# Patient Record
Sex: Female | Born: 1960 | Race: White | Hispanic: No | Smoking: Current every day smoker
Health system: Southern US, Community
[De-identification: ages and names within clinical notes are randomized; demographics above are authoritative.]

## PROBLEM LIST (undated history)

## (undated) DIAGNOSIS — N3289 Other specified disorders of bladder: Secondary | ICD-10-CM

## (undated) DIAGNOSIS — K5900 Constipation, unspecified: Secondary | ICD-10-CM

## (undated) DIAGNOSIS — R159 Full incontinence of feces: Secondary | ICD-10-CM

## (undated) DIAGNOSIS — G63 Polyneuropathy in diseases classified elsewhere: Secondary | ICD-10-CM

## (undated) DIAGNOSIS — L97409 Non-pressure chronic ulcer of unspecified heel and midfoot with unspecified severity: Secondary | ICD-10-CM

## (undated) DIAGNOSIS — K219 Gastro-esophageal reflux disease without esophagitis: Secondary | ICD-10-CM

## (undated) DIAGNOSIS — G8929 Other chronic pain: Secondary | ICD-10-CM

## (undated) DIAGNOSIS — R32 Unspecified urinary incontinence: Secondary | ICD-10-CM

## (undated) DIAGNOSIS — F32A Depression, unspecified: Secondary | ICD-10-CM

## (undated) DIAGNOSIS — G834 Cauda equina syndrome: Secondary | ICD-10-CM

## (undated) DIAGNOSIS — F419 Anxiety disorder, unspecified: Secondary | ICD-10-CM

## (undated) DIAGNOSIS — M545 Low back pain, unspecified: Secondary | ICD-10-CM

## (undated) DIAGNOSIS — F329 Major depressive disorder, single episode, unspecified: Secondary | ICD-10-CM

## (undated) HISTORY — DX: Low back pain: M54.5

## (undated) HISTORY — DX: Unspecified urinary incontinence: R32

## (undated) HISTORY — DX: Polyneuropathy in diseases classified elsewhere: G63

## (undated) HISTORY — PX: RECTOCELE REPAIR: SHX761

## (undated) HISTORY — DX: Gastro-esophageal reflux disease without esophagitis: K21.9

## (undated) HISTORY — DX: Cauda equina syndrome: G83.4

## (undated) HISTORY — DX: Low back pain, unspecified: M54.50

## (undated) HISTORY — DX: Other chronic pain: G89.29

## (undated) HISTORY — DX: Constipation, unspecified: K59.00

## (undated) HISTORY — PX: GALLBLADDER SURGERY: SHX652

## (undated) HISTORY — DX: Other specified disorders of bladder: N32.89

## (undated) HISTORY — DX: Major depressive disorder, single episode, unspecified: F32.9

## (undated) HISTORY — DX: Non-pressure chronic ulcer of unspecified heel and midfoot with unspecified severity: L97.409

## (undated) HISTORY — PX: LUMBAR LAMINECTOMY: SHX95

## (undated) HISTORY — DX: Full incontinence of feces: R15.9

## (undated) HISTORY — DX: Anxiety disorder, unspecified: F41.9

## (undated) HISTORY — PX: TUBAL LIGATION: SHX77

## (undated) HISTORY — DX: Depression, unspecified: F32.A

---

## 2013-05-09 ENCOUNTER — Encounter: Payer: Self-pay | Admitting: Neurology

## 2013-05-12 ENCOUNTER — Ambulatory Visit (INDEPENDENT_AMBULATORY_CARE_PROVIDER_SITE_OTHER): Payer: Medicaid Other | Admitting: Neurology

## 2013-05-12 ENCOUNTER — Encounter: Payer: Self-pay | Admitting: Neurology

## 2013-05-12 VITALS — BP 143/88 | HR 71 | Ht 65.0 in | Wt 153.0 lb

## 2013-05-12 DIAGNOSIS — M545 Low back pain, unspecified: Secondary | ICD-10-CM

## 2013-05-12 MED ORDER — CARBAMAZEPINE 100 MG PO CHEW: 100.0000 mg | CHEWABLE_TABLET | Freq: Two times a day (BID) | ORAL | Status: DC

## 2013-05-12 NOTE — Progress Notes (Signed)
Reason for visit: Back pain  Tanya Carter is a 52 y.o. female  History of present illness:  Tanya Carter is a 52 year old right-handed white female with a history of chronic low back pain dating back 15-20 years. The patient was told that she had severe nerve root impingement prior to surgery. The patient has had 2 lumbosacral spine surgeries that were done in [place]. The patient has had some chronic low back pain since that time, and some difficulty controlling the bladder and bowels. The patient indicates that 4 months ago, she began having a significant increase in the low back pain. The patient has pain down both legs, worse on the right side. The patient will have an electric shock pain going up the right leg. The patient will have a dull achy pain in the left hip and back. The patient feels normal on the right leg. The patient has not had any falls but she feels as if the legs are weak. The patient denies any neck pain or shoulder or arm discomfort. The patient is on gabapentin without complete relief. The patient is sent to this office for further evaluation.  Past Medical History  Diagnosis Date  . Cauda equina compression   . Incontinence of feces   . Incontinence of urine   . Chronic pain     sacral  . Anxiety and depression   . Ulcer of heel and midfoot     Managed by [facility]  . GERD (gastroesophageal reflux disease)   . Lumbago   . Unspecified constipation   . Other specified disorders of bladder     Past Surgical History  Procedure Laterality Date  . Rectocele repair    . Gallbladder surgery    . Tubal ligation    . Lumbar laminectomy      2 occasions    Family History  Problem Relation Age of Onset  . Stroke Mother     Social history:  reports that she has been smoking.  She has never used smokeless tobacco. She reports that she does not drink alcohol or use illicit drugs.  Medications:  Current Outpatient Prescriptions on File Prior to  Visit  Medication Sig Dispense Refill  . cyanocobalamin (,VITAMIN B-12,) 1000 MCG/ML injection Inject 1,000 mcg into the muscle every 30 (thirty) days.      . DULoxetine (CYMBALTA) 60 MG capsule Take 60 mg by mouth daily.       . mupirocin ointment (BACTROBAN) 2 % Apply 2 application topically 3 (three) times daily.      . pantoprazole (PROTONIX) 40 MG tablet Take 40 mg by mouth daily.       No current facility-administered medications on file prior to visit.    Allergies:  Allergies  Allergen Reactions  . Klonopin [Clonazepam] Other (See Comments)    Delirium,Hallucination    ROS:  Out of a complete 14 system review of symptoms, the patient complains only of the following symptoms, and all other reviewed systems are negative.  Moles Urination problems, incontinence Increased thirst Headache, numbness, weakness Depression, anxiety, decreased energy Restless legs  Blood pressure 143/88, pulse 71, height 5\' 5"  (1.651 m), weight 153 lb (69.4 kg).  Physical Exam  General: The patient is alert and cooperative at the time of the examination.  Head: Pupils are equal, round, and reactive to light. Discs are flat bilaterally.  Neck: The neck is supple, no carotid bruits are noted.  Respiratory: The respiratory examination is clear.  Cardiovascular: The cardiovascular examination reveals a regular rate and rhythm, no obvious murmurs or rubs are noted.  Neuromuscular: The patient lacks about 15-20 of full flexion of the low back.  Skin: Extremities are without significant edema.  Neurologic Exam  Mental status:  Cranial nerves: Facial symmetry is present. There is good sensation of the face to pinprick and soft touch bilaterally. The strength of the facial muscles and the muscles to head turning and shoulder shrug are normal bilaterally. Speech is well enunciated, no aphasia or dysarthria is noted. Extraocular movements are full. Visual fields are full.  Motor: The motor  testing reveals 5 over 5 strength of all 4 extremities, but the patient does have some give way weakness proximally with the right leg. The patient is unable to walk on the toes on either side, she can walk with difficulty on heels bilaterally. Good symmetric motor tone is noted throughout.  Sensory: Sensory testing is intact to pinprick, soft touch, vibration sensation, and position sense on all 4 extremities, with exception of some decreased pinprick sensation on the right leg from the thigh level down. No evidence of extinction is noted.  Coordination: Cerebellar testing reveals good finger-nose-finger and heel-to-shin bilaterally.  Gait and station: Gait is normal. Tandem gait is unsteady. Romberg is negative, but is slightly unsteady. No drift is seen.  Reflexes: Deep tendon reflexes are symmetric and normal bilaterally, with exception that the ankle jerk reflexes are depressed bilaterally. Toes are downgoing bilaterally.   Assessment/Plan:  1. Low back pain, bilateral leg pain  The patient will be set up for MRI evaluation of the lumbosacral spine. Depending upon the results, the patient may be referred for an epidural steroid injection or for surgery. The patient will be given carbamazepine for the sharp electric shock pains that she is experiencing. The patient followup in 3-4 months.  Marlan Palau MD 05/12/2013 7:46 PM  [facility] [address] [place], [place] [address]  Phone [phone] Fax [phone]

## 2013-05-21 DIAGNOSIS — M545 Low back pain, unspecified: Secondary | ICD-10-CM

## 2013-05-23 ENCOUNTER — Telehealth: Payer: Self-pay | Admitting: Neurology

## 2013-05-23 ENCOUNTER — Other Ambulatory Visit: Payer: Self-pay | Admitting: Diagnostic Neuroimaging

## 2013-05-23 DIAGNOSIS — M545 Low back pain, unspecified: Secondary | ICD-10-CM

## 2013-05-23 NOTE — Telephone Encounter (Signed)
I called patient. The MRI of the lumbosacral spine shows multilevel facet joint arthritis, and some bone marrow edema at the L4-5 level. There may be potential for impingement of the L4 nerve roots bilaterally. I'll check EMG evaluation of the legs, but the patient wishes to have an epidural steroid injection, she will call our office as well.

## 2013-05-27 ENCOUNTER — Telehealth: Payer: Self-pay | Admitting: Neurology

## 2013-05-27 DIAGNOSIS — G544 Lumbosacral root disorders, not elsewhere classified: Secondary | ICD-10-CM

## 2013-05-28 NOTE — Telephone Encounter (Signed)
I called the patient. The patient gained some benefit from carbamazepine, but the relief is not significant. The patient is only on 100 mg twice daily, we will increase to 200 mg twice daily. The patient will be coming in for EMG and nerve conduction studies, and she wants to go ahead and get an epidural steroid injection of the low back. She is applying for disability.

## 2013-05-30 ENCOUNTER — Other Ambulatory Visit: Payer: Self-pay | Admitting: Neurology

## 2013-05-30 DIAGNOSIS — M549 Dorsalgia, unspecified: Secondary | ICD-10-CM

## 2013-06-02 ENCOUNTER — Other Ambulatory Visit: Payer: Self-pay

## 2013-06-02 ENCOUNTER — Telehealth: Payer: Self-pay | Admitting: Neurology

## 2013-06-02 MED ORDER — CARBAMAZEPINE 200 MG PO TABS: 200.0000 mg | ORAL_TABLET | Freq: Two times a day (BID) | ORAL | Status: DC

## 2013-06-02 NOTE — Telephone Encounter (Signed)
Rx sent 

## 2013-06-05 ENCOUNTER — Ambulatory Visit
Admission: RE | Admit: 2013-06-05 | Discharge: 2013-06-05 | Disposition: A | Discharge status: Home / Self Care | Payer: Medicaid Other | Source: Ambulatory Visit | Attending: Neurology | Admitting: Neurology

## 2013-06-05 VITALS — BP 127/87 | HR 72

## 2013-06-05 DIAGNOSIS — M549 Dorsalgia, unspecified: Secondary | ICD-10-CM

## 2013-06-05 MED ORDER — IOHEXOL 180 MG/ML  SOLN
1.0000 mL | Freq: Once | INTRAMUSCULAR | Status: AC | PRN
  Administered 2013-06-05: 1 mL via EPIDURAL

## 2013-06-05 MED ORDER — METHYLPREDNISOLONE ACETATE 40 MG/ML INJ SUSP (RADIOLOG
120.0000 mg | Freq: Once | INTRAMUSCULAR | Status: AC
  Administered 2013-06-05: 120 mg via EPIDURAL

## 2013-06-16 ENCOUNTER — Encounter: Payer: Self-pay | Admitting: Neurology

## 2013-06-16 ENCOUNTER — Encounter (INDEPENDENT_AMBULATORY_CARE_PROVIDER_SITE_OTHER): Payer: Self-pay

## 2013-06-16 ENCOUNTER — Ambulatory Visit (INDEPENDENT_AMBULATORY_CARE_PROVIDER_SITE_OTHER): Payer: Medicaid Other | Admitting: Neurology

## 2013-06-16 DIAGNOSIS — G63 Polyneuropathy in diseases classified elsewhere: Secondary | ICD-10-CM

## 2013-06-16 DIAGNOSIS — M545 Low back pain: Secondary | ICD-10-CM

## 2013-06-16 DIAGNOSIS — Z5181 Encounter for therapeutic drug level monitoring: Secondary | ICD-10-CM

## 2013-06-16 DIAGNOSIS — Z0289 Encounter for other administrative examinations: Secondary | ICD-10-CM

## 2013-06-16 HISTORY — DX: Polyneuropathy in diseases classified elsewhere: G63

## 2013-06-16 NOTE — Progress Notes (Signed)
Tanya Carter is a 52 year old patient with a history of lumbosacral spine surgery done on 2 occasions. The patient has had MRI evaluation the lumbosacral spine that shows evidence of spondylosis, with facet joint edema and possible bilateral L4 nerve root impingement. The patient comes back in for EMG and nerve conduction study evaluation.  Nerve conduction studies show evidence of a peripheral neuropathy, but EMG evaluations of both lower extremity suggests that the neuropathy is not as severe as nerve conduction study would suggest. The patient likely has bilateral chronic S1 radiculopathies. No acute radiculopathies were noted.  The patient has gained some benefit with the carbamazepine. A carbamazepine level will be checked today, and blood work will be done to look for etiologies of peripheral neuropathies. The patient will followup in 4 months.

## 2013-06-16 NOTE — Procedures (Signed)
HISTORY:  Tanya Carter is a 52 year old patient with a history of lumbosacral spine surgery on 2 occasions. The patient has chronic low back pain, and discomfort down the legs, right greater than left. The patient reports a crawling, tingling sensation in both feet. This is worse in the evening hours. The patient is being evaluated for a possible peripheral neuropathy or a lumbosacral radiculopathy.  NERVE CONDUCTION STUDIES:  Nerve conduction studies were performed on the right upper extremity. The distal motor latencies and motor amplitudes for the median and ulnar nerves were within normal limits. The F wave latencies and nerve conduction velocities for these nerves were also normal. The sensory latencies for the median and ulnar nerves were normal.  Nerve conduction studies were performed on both lower extremities. The distal motor latencies for the peroneal and posterior tibial nerves were normal bilaterally. The motor amplitudes for these nerves were low bilaterally, with normal nerve conduction velocities for these nerves bilaterally. The H reflex latencies were absent bilaterally, and the peroneal sensory latencies were absent bilaterally.  EMG STUDIES:  EMG study was performed on the right lower extremity:  The tibialis anterior muscle reveals 2 to 4K motor units with full recruitment. No fibrillations or positive waves were seen. The peroneus tertius muscle reveals 2 to 4K motor units with full recruitment. No fibrillations or positive waves were seen. The medial gastrocnemius muscle reveals 1 to 3K motor units with decreased recruitment. No fibrillations or positive waves were seen. The vastus lateralis muscle reveals 2 to 4K motor units with full recruitment. No fibrillations or positive waves were seen. The iliopsoas muscle reveals 2 to 4K motor units with full recruitment. No fibrillations or positive waves were seen. The biceps femoris muscle (long head) reveals 2 to 4K motor  units with full recruitment. No fibrillations or positive waves were seen. The lumbosacral paraspinal muscles were tested at 3 levels, and revealed no abnormalities of insertional activity at all 3 levels tested. There was good relaxation.  EMG study was performed on the left lower extremity:  The tibialis anterior muscle reveals 2 to 4K motor units with full recruitment. No fibrillations or positive waves were seen. The peroneus tertius muscle reveals 2 to 4K motor units with full recruitment. No fibrillations or positive waves were seen. The medial gastrocnemius muscle reveals 2 to 5K motor units with decreased recruitment. No fibrillations or positive waves were seen. The vastus lateralis muscle reveals 2 to 4K motor units with full recruitment. No fibrillations or positive waves were seen. The iliopsoas muscle reveals 2 to 4K motor units with full recruitment. No fibrillations or positive waves were seen. The biceps femoris muscle (long head) reveals 2 to 4K motor units with full recruitment. No fibrillations or positive waves were seen. The lumbosacral paraspinal muscles were tested at 3 levels, and revealed no abnormalities of insertional activity at all 3 levels tested. There was good relaxation.   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 EMG evaluation of the right lower extremity was relatively unremarkable, with exception that there are some findings consistent with a chronic stable S1 radiculopathy. EMG of the left lower extremity shows similar findings, and EMG evaluation suggests that the severity of the peripheral neuropathy is less severe than the nerve conduction studies suggest.  Marlan Palau MD 06/16/2013 4:07 PM  [facility] [address] [place], [place] [address]  Phone [phone] Fax [phone]  

## 2013-06-18 LAB — IFE AND PE, SERUM
Alpha 1: 0.2 g/dL (ref 0.1–0.4)
Alpha2 Glob SerPl Elph-Mcnc: 0.7 g/dL (ref 0.4–1.2)
B-Globulin SerPl Elph-Mcnc: 1 g/dL (ref 0.6–1.3)
Globulin, Total: 3.2 g/dL (ref 2.0–4.5)
IgM (Immunoglobulin M), Srm: 86 mg/dL (ref 40–230)

## 2013-06-18 LAB — CBC WITH DIFFERENTIAL
Basophils Absolute: 0.1 10*3/uL (ref 0.0–0.2)
Basos: 1 %
Eos: 2 %
HCT: 43.1 % (ref 34.0–46.6)
Hemoglobin: 14.6 g/dL (ref 11.1–15.9)
Immature Granulocytes: 0 %
Lymphocytes Absolute: 2 10*3/uL (ref 0.7–3.1)
Lymphs: 36 %
MCH: 32.2 pg (ref 26.6–33.0)
MCHC: 33.9 g/dL (ref 31.5–35.7)
MCV: 95 fL (ref 79–97)
Monocytes Absolute: 0.6 10*3/uL (ref 0.1–0.9)
Monocytes: 10 %
Neutrophils Absolute: 2.9 10*3/uL (ref 1.4–7.0)
Platelets: 302 10*3/uL (ref 150–379)
RBC: 4.53 x10E6/uL (ref 3.77–5.28)
RDW: 13.7 % (ref 12.3–15.4)
WBC: 5.7 10*3/uL (ref 3.4–10.8)

## 2013-06-18 LAB — CARBAMAZEPINE LEVEL, TOTAL: Carbamazepine Lvl: 6.3 ug/mL (ref 4.0–12.0)

## 2013-06-18 LAB — COMPREHENSIVE METABOLIC PANEL
ALT: 10 IU/L (ref 0–32)
AST: 11 IU/L (ref 0–40)
Albumin: 4.6 g/dL (ref 3.5–5.5)
Alkaline Phosphatase: 92 IU/L (ref 39–117)
BUN: 14 mg/dL (ref 6–24)
CO2: 27 mmol/L (ref 18–29)
Calcium: 9.4 mg/dL (ref 8.7–10.2)
Chloride: 98 mmol/L (ref 97–108)
GFR calc Af Amer: 100 mL/min/{1.73_m2} (ref >59)
GFR calc non Af Amer: 87 mL/min/{1.73_m2} (ref >59)
Glucose: 85 mg/dL (ref 65–99)
Potassium: 4.1 mmol/L (ref 3.5–5.2)
Sodium: 139 mmol/L (ref 134–144)
Total Bilirubin: 0.2 mg/dL (ref 0.0–1.2)
Total Protein: 7.6 g/dL (ref 6.0–8.5)

## 2013-06-18 LAB — ANGIOTENSIN CONVERTING ENZYME: Angio Convert Enzyme: 43 U/L (ref 14–82)

## 2013-06-18 LAB — RHEUMATOID FACTOR: Rhuematoid fact SerPl-aCnc: 12.4 IU/mL (ref 0.0–13.9)

## 2013-06-18 LAB — HIV ANTIBODY (ROUTINE TESTING W REFLEX): HIV-1/HIV-2 Ab: NONREACTIVE

## 2013-06-18 LAB — TSH: TSH: 2.07 u[IU]/mL (ref 0.450–4.500)

## 2013-06-18 LAB — VITAMIN B12: Vitamin B-12: 308 pg/mL (ref 211–946)

## 2013-06-19 ENCOUNTER — Telehealth: Payer: Self-pay

## 2013-06-19 ENCOUNTER — Telehealth: Payer: Self-pay | Admitting: Neurology

## 2013-06-19 MED ORDER — PREDNISONE 5 MG PO TABS: ORAL_TABLET | ORAL | Status: DC

## 2013-06-19 MED ORDER — TRAMADOL HCL 50 MG PO TABS: 50.0000 mg | ORAL_TABLET | Freq: Four times a day (QID) | ORAL | Status: DC | PRN

## 2013-06-19 NOTE — Telephone Encounter (Signed)
Message copied by [facility] on Thu Jun 19, 2013  9:40 AM ------      Message from: Stephanie Acre      Created: Wed Jun 18, 2013  4:42 PM       Please call the patient. The blood work results are unremarkable. Thank you. The patient has good carbamazepine level.            ----- Message -----         From: [facility] Lab Results In Interface         Sent: 06/18/2013  11:57 AM           To: York Spaniel, MD                   ------

## 2013-06-19 NOTE — Telephone Encounter (Signed)
I called patient. The patient indicates that she has headaches on average twice a month. The patient has had a headache for the last 3 days. I will call in a prednisone Dosepak, and some Ultram if needed for pain.

## 2013-06-19 NOTE — Telephone Encounter (Signed)
I called patient and let her know her lab work was fine and continue her medications. Patient states she called a few minutes ago and left a message that she has had a migraine headache for the past 3 days. She has been able to treat it with over the counter medications. She has one now and she has not been able to have any relief from over the counter medications. Patient has difficulty with getting transportation and is requesting that Dr. Anne Hahn take a look at prescribing something to help her with this.   I let patient know I will print note and hopefully get back to her by noon.

## 2013-06-20 NOTE — Telephone Encounter (Signed)
Dr. Anne Hahn addressed this on 06/19/13. Closing this encounter.

## 2013-07-07 ENCOUNTER — Telehealth: Payer: Self-pay | Admitting: Neurology

## 2013-07-07 MED ORDER — TRAMADOL HCL 50 MG PO TABS: 50.0000 mg | ORAL_TABLET | Freq: Four times a day (QID) | ORAL | Status: DC | PRN

## 2013-07-07 MED ORDER — PREDNISONE 5 MG PO TABS: ORAL_TABLET | ORAL | Status: DC

## 2013-07-07 NOTE — Telephone Encounter (Signed)
I Called patient. The patient awoke this morning with increased pain on her right foot. I will call in a prednisone Dosepak. The patient is on carbamazepine taking 200 mg twice daily. The patient has had good levels on this dose.

## 2013-07-07 NOTE — Telephone Encounter (Signed)
Call pt about the medication that she needs a refill on. Pt states that she is out of tramadol and the tegretol is not enough for the pain that she is having. Please advise.

## 2013-07-30 ENCOUNTER — Telehealth: Payer: Self-pay | Admitting: Neurology

## 2013-07-30 NOTE — Telephone Encounter (Addendum)
Patient said that  nurse at [facility] told her that she had to take a narcotic screening and see a psychriatrist within 30 days, she told them that she  has no transportation,was dismissed.Called [facility] to confirm but  they are in the process of moving  today to [place].  Patient wants to know if Dr Anne Hahn will still treat, manage her pain.

## 2013-07-30 NOTE — Telephone Encounter (Signed)
I called patient. The patient apparently was discharged from the pain Center, as she did not have transportation. The patient will be set up for a revisit within 6-8 weeks.

## 2013-07-31 NOTE — Telephone Encounter (Signed)
Patient sched w/ NP/confirmed appt.

## 2013-08-12 ENCOUNTER — Ambulatory Visit: Payer: Medicaid Other | Admitting: Nurse Practitioner

## 2013-09-05 ENCOUNTER — Telehealth: Payer: Self-pay | Admitting: *Deleted

## 2013-09-05 ENCOUNTER — Other Ambulatory Visit: Payer: Self-pay | Admitting: Neurology

## 2013-09-05 NOTE — Telephone Encounter (Signed)
This has already been taken care of

## 2013-09-24 ENCOUNTER — Other Ambulatory Visit: Payer: Self-pay | Admitting: Neurology

## 2013-09-29 NOTE — Telephone Encounter (Signed)
Patient calling about tramadol refill, patient is completely out. Patient states pharmacy has sent 2 faxes already.

## 2013-10-13 ENCOUNTER — Ambulatory Visit (INDEPENDENT_AMBULATORY_CARE_PROVIDER_SITE_OTHER): Payer: Medicaid Other | Admitting: Nurse Practitioner

## 2013-10-13 ENCOUNTER — Encounter (INDEPENDENT_AMBULATORY_CARE_PROVIDER_SITE_OTHER): Payer: Self-pay

## 2013-10-13 ENCOUNTER — Encounter: Payer: Self-pay | Admitting: Nurse Practitioner

## 2013-10-13 VITALS — BP 120/78 | HR 59 | Ht 65.0 in | Wt 160.0 lb

## 2013-10-13 DIAGNOSIS — M545 Low back pain, unspecified: Secondary | ICD-10-CM

## 2013-10-13 DIAGNOSIS — G63 Polyneuropathy in diseases classified elsewhere: Secondary | ICD-10-CM

## 2013-10-13 MED ORDER — DULOXETINE HCL 60 MG PO CPEP: 60.0000 mg | ORAL_CAPSULE | Freq: Every day | ORAL | Status: DC

## 2013-10-13 MED ORDER — GABAPENTIN 600 MG PO TABS: 600.0000 mg | ORAL_TABLET | Freq: Three times a day (TID) | ORAL | Status: DC

## 2013-10-13 NOTE — Progress Notes (Signed)
I have read the note, and I agree with the clinical assessment and plan.  Shadrick Senne KEITH   

## 2013-10-13 NOTE — Progress Notes (Signed)
[facility]  PATIENT: Tanya Carter DOB: 1961-01-02   REASON FOR VISIT: follow up for back pain, peripheral neuropathy    HISTORY OF PRESENT ILLNESS:Tanya Carter, 53 year old female returns for followup. She was last seen in this office 05/12/2013 by Dr. Anne Hahn for back pain which has been going on for 15-20 years. The MRI of the lumbosacral spine shows multilevel facet joint arthritis, and some bone marrow edema at the L4-5 level. There may be potential for impingement of the L4 nerve roots bilaterally. 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 She had an epidural injection and nerve block at [place]  [facility] on 06/05/13 with good results for about 3 months. She is currently taking Tegretol, Cymbalta, Neurontin and tramadol for her pain. She returns for reevaluation   HISTORY: of chronic low back pain dating back 15-20 years. The patient was told that she had severe nerve root impingement prior to surgery. The patient has had 2 lumbosacral spine surgeries that were done in [place]. The patient has had some chronic low back pain since that time, and some difficulty controlling the bladder and bowels. The patient indicates that 4 months ago, she began having a significant increase in the low back pain. The patient has pain down both legs, worse on the right side. The patient will have an electric shock pain going up the right leg. The patient will have a dull achy pain in the left hip and back. The patient feels normal on the right leg. The patient has not had any falls but she feels as if the legs are weak. The patient denies any neck pain or shoulder or arm discomfort. The patient is on gabapentin without complete relief.   REVIEW OF SYSTEMS: Full 14 system review of systems performed and notable only for those listed, all others are neg:  Constitutional:  fatigue Cardiovascular: N/A  Ear/Nose/Throat: N/A  Skin: N/A  Eyes: N/A    Respiratory: N/A  Gastroitestinal: N/A  Genitourinary; incontinence of bladder Hematology/Lymphatic: N/A  Endocrine: N/A Musculoskeletal:N/A  Allergy/Immunology: N/A  Neurological: Headache  Psychiatric: Depression anxiety   ALLERGIES: Allergies  Allergen Reactions  . Klonopin [Clonazepam] Other (See Comments)    Delirium,Hallucination    HOME MEDICATIONS: Outpatient Prescriptions Prior to Visit  Medication Sig Dispense Refill  . carbamazepine (TEGRETOL) 200 MG tablet TAKE 1 TABLET BY MOUTH TWICE DAILY  180 tablet  0  . cyanocobalamin (,VITAMIN B-12,) 1000 MCG/ML injection Inject 1,000 mcg into the muscle every 30 (thirty) days.      . DULoxetine (CYMBALTA) 60 MG capsule Take 60 mg by mouth daily.       Marland Kitchen gabapentin (NEURONTIN) 600 MG tablet Take 600 mg by mouth 3 (three) times daily.      . mupirocin ointment (BACTROBAN) 2 % Apply 2 application topically 3 (three) times daily.      . pantoprazole (PROTONIX) 40 MG tablet Take 40 mg by mouth daily.      . predniSONE (DELTASONE) 5 MG tablet Began taking 6 tablets daily, taper by one tablet daily until off the medication.  21 tablet  0  . traMADol (ULTRAM) 50 MG tablet TAKE 1 TABLET BY MOUTH EVERY 6 HOURS AS NEEDED FOR PAIN  30 tablet  1   No facility-administered medications prior to visit.    PAST MEDICAL HISTORY: Past Medical History  Diagnosis Date  . Cauda equina compression   . Incontinence of feces   . Incontinence of urine   .  Chronic pain     sacral  . Anxiety and depression   . Ulcer of heel and midfoot     Managed by [facility]  . GERD (gastroesophageal reflux disease)   . Lumbago   . Unspecified constipation   . Other specified disorders of bladder   . Polyneuropathy in other diseases classified elsewhere 06/16/2013    PAST SURGICAL HISTORY: Past Surgical History  Procedure Laterality Date  . Rectocele repair    . Gallbladder surgery    . Tubal ligation    . Lumbar laminectomy      2  occasions    FAMILY HISTORY: Family History  Problem Relation Age of Onset  . Stroke Mother     SOCIAL HISTORY: History   Social History  . Marital Status: Widowed    Spouse Name: N/A    Number of Children: 3  . Years of Education: 12   Occupational History  . unemployed    Social History Main Topics  . Smoking status: Current Every Day Smoker -- 0.50 packs/day for 3 years  . Smokeless tobacco: Never Used  . Alcohol Use: No     Comment: past:occasionally  . Drug Use: No  . Sexual Activity: Not on file   Other Topics Concern  . Not on file   Social History Narrative   Patient lives at home alone.    Patient has 3 children.    Patient is right-handed   Patient has a 12th grade education.   Patient is widowed.    Patient is on disability.     PHYSICAL EXAM  Filed Vitals:   10/13/13 1359  BP: 120/78  Pulse: 59  Height: 5\' 5"  (1.651 m)  Weight: 160 lb (72.576 kg)   Body mass index is 26.63 kg/(m^2).  Generalized: Well developed, in no acute distress  Head: normocephalic and atraumatic,. Oropharynx benign  Neck: Supple, no carotid bruits  Cardiac: Regular rate rhythm, no murmur  Musculoskeletal: Lacks about 15 degrees of full flexion of the low back.    Neurological examination   Mentation: Alert oriented to time, place, history taking. Follows all commands speech and language fluent  Cranial nerve II-XII: .Pupils were equal round reactive to light extraocular movements were full, visual field were full on confrontational test. Facial sensation and strength were normal. hearing was intact to finger rubbing bilaterally. Uvula tongue midline. head turning and shoulder shrug were normal and symmetric.Tongue protrusion into cheek strength was normal. Motor: normal bulk and tone, full strength in the BUE, BLE, fine finger movements normal, no pronator drift. No focal weakness Sensory: normal and symmetric to light touch, pinprick, and  vibration  Coordination:  finger-nose-finger, heel-to-shin bilaterally, no dysmetria Reflexes: Brachioradialis 2/2, biceps 2/2, triceps 2/2, patellar 2/2, Achilles 1/1, plantar responses were flexor bilaterally. Gait and Station: Rising up from seated position without assistance, normal stance,  moderate stride, good arm swing, smooth turning, un able to perform tiptoe, or heel walking. Tandem gait is unsteady  DIAGNOSTIC DATA (LABS, IMAGING, TESTING) - I reviewed patient records, labs, notes, testing and imaging myself where available.  Lab Results  Component Value Date   WBC 5.7 06/16/2013   HGB 14.6 06/16/2013   HCT 43.1 06/16/2013   MCV 95 06/16/2013   PLT 302 06/16/2013      Component Value Date/Time   NA 139 06/16/2013 1417   K 4.1 06/16/2013 1417   CL 98 06/16/2013 1417   CO2 27 06/16/2013 1417   GLUCOSE 85  06/16/2013 1417   BUN 14 06/16/2013 1417   CREATININE 0.79 06/16/2013 1417   CALCIUM 9.4 06/16/2013 1417   PROT 7.6 06/16/2013 1417   AST 11 06/16/2013 1417   ALT 10 06/16/2013 1417   ALKPHOS 92 06/16/2013 1417   BILITOT 0.2 06/16/2013 1417   GFRNONAA 87 06/16/2013 1417   GFRAA 100 06/16/2013 1417    Lab Results  Component Value Date   VITAMINB12 308 06/16/2013   Lab Results  Component Value Date   TSH 2.070 06/16/2013      ASSESSMENT AND PLAN  53 y.o. year old female  has a past medical history of Cauda equina compression; Incontinence of feces; Incontinence of urine; Chronic pain; Anxiety and depression; Ulcer of heel and midfoot; GERD (gastroesophageal reflux disease); Lumbago; Unspecified constipation; Other specified disorders of bladder; and Polyneuropathy in other diseases classified elsewhere (06/16/2013). here in followup.The MRI of the lumbosacral spine shows multilevel facet joint arthritis, and some bone marrow edema at the L4-5 level. There may be potential for impingement of the L4 nerve roots bilaterally. 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 She had an  epidural injection and nerve block at [place]   [facility] on 06/05/13 with good results for about 3 months.   Will set up for epidural injection  Continue Cymbalta carbamazepine and tramadol and neurontin Given patient information on peripheral neuropathy F/U 6 months Nilda Riggs, [facility], [facility], APRN  [facility] [address] [place], [place] [address] [phone]

## 2013-10-13 NOTE — Patient Instructions (Signed)
Need epidural injection will refer Continue Cymbalta carbamazepine and tramadol and neurontin Given information on peripheral neuropathy F/U 6 months

## 2013-10-14 ENCOUNTER — Other Ambulatory Visit: Payer: Self-pay | Admitting: Nurse Practitioner

## 2013-10-14 DIAGNOSIS — M545 Low back pain, unspecified: Secondary | ICD-10-CM

## 2013-10-16 ENCOUNTER — Ambulatory Visit: Payer: Self-pay | Admitting: Nurse Practitioner

## 2013-10-16 ENCOUNTER — Ambulatory Visit
Admission: RE | Admit: 2013-10-16 | Discharge: 2013-10-16 | Disposition: A | Discharge status: Home / Self Care | Payer: Medicaid Other | Source: Ambulatory Visit | Attending: Nurse Practitioner | Admitting: Nurse Practitioner

## 2013-10-16 VITALS — BP 140/83 | HR 59

## 2013-10-16 DIAGNOSIS — M545 Low back pain, unspecified: Secondary | ICD-10-CM

## 2013-10-16 MED ORDER — METHYLPREDNISOLONE ACETATE 40 MG/ML INJ SUSP (RADIOLOG
120.0000 mg | Freq: Once | INTRAMUSCULAR | Status: AC
  Administered 2013-10-16: 120 mg via EPIDURAL

## 2013-10-16 MED ORDER — IOHEXOL 180 MG/ML  SOLN
1.0000 mL | Freq: Once | INTRAMUSCULAR | Status: AC | PRN
  Administered 2013-10-16: 1 mL via EPIDURAL

## 2013-10-22 ENCOUNTER — Telehealth: Payer: Self-pay | Admitting: Nurse Practitioner

## 2013-10-22 NOTE — Telephone Encounter (Signed)
Pt states she was sent to have an injection by provider. States she was told that she needed to have them every three months and the RX was only sent for the one injection. She states that in order for them to schedule them for her she needs something sent stating that is should be every 3months

## 2013-10-23 NOTE — Telephone Encounter (Signed)
Called patient and she stated that the injection helped ,but the pain is still there. Patient would like to know what do you suggest that she do at this point to take care of the pain. Please advise.

## 2013-10-23 NOTE — Telephone Encounter (Signed)
Patient returning call, please call her back.

## 2013-10-23 NOTE — Telephone Encounter (Signed)
Sometimes the injections last for 6 months , 1 year  Or 1 week. It depends on the pt. If she needs another injection in 3 months call us to set up.Please call the pt.

## 2013-10-23 NOTE — Telephone Encounter (Signed)
Patient called stating that she needs Eber Jonesarolyn, NP to send an order stating that the patient needs the injections every 3 months, so the patient can set the appts. Up. Patient had injection on 10/16/13. If this was just a one time visit, patient would like to know, if not could you please send order. Patient would like a call back.  Please advise.

## 2013-10-23 NOTE — Telephone Encounter (Signed)
TC to patient she needs something for pain. Made her aware she has Tramadol prn called in by Dr. Anne HahnWillis on 12/31/ . She will call when it is time for epidural again for us to set up.

## 2013-11-20 ENCOUNTER — Other Ambulatory Visit: Payer: Self-pay | Admitting: Neurology

## 2013-11-24 ENCOUNTER — Telehealth: Payer: Self-pay | Admitting: Neurology

## 2013-11-24 ENCOUNTER — Telehealth: Payer: Self-pay

## 2013-11-24 MED ORDER — CARBAMAZEPINE ER 300 MG PO CP12: 300.0000 mg | ORAL_CAPSULE | Freq: Two times a day (BID) | ORAL | Status: DC

## 2013-11-24 NOTE — Telephone Encounter (Signed)
I called patient. The patient indicates that her medications in the wearing off at night. I'll increase the carbamazepine to 300 mg twice daily. I'll switched to Carbatrol.

## 2013-11-24 NOTE — Telephone Encounter (Signed)
I spoke with the patient who says she is having pain in her feet.  Said she is taking her Carbamazepine, Cymbalta and Gabapentin, which do help, but it does not seem to last long enough.  States at times she wakes up around 3am with pain.  She would like to know if med doses could be adjusted.  Please advise.  Thank you.

## 2013-11-24 NOTE — Telephone Encounter (Signed)
Pt called states her pharmacy faxed over last Thursday a refill req for traMADol (ULTRAM) 50 MG tablet, states they have not received it back yet. I advised pt were were closed last Thursday and opened late Friday.   Pt also is has questions concerning her medication carbamazepine (TEGRETOL) 200 MG tablet and gabapentin (NEURONTIN) 600 MG tablet, and she is having problems with her foot during the night.

## 2013-11-24 NOTE — Telephone Encounter (Signed)
Tramadol has been sent to the pharmacy.  I called the patient back.  She is aware.

## 2013-11-30 ENCOUNTER — Other Ambulatory Visit: Payer: Self-pay | Admitting: Neurology

## 2013-12-08 ENCOUNTER — Telehealth: Payer: Self-pay | Admitting: Neurology

## 2013-12-08 NOTE — Telephone Encounter (Signed)
Last was done on 10/14/13. She can have a repeat 3 to 4 months, no more than 3 in a year. Please call patient

## 2013-12-08 NOTE — Telephone Encounter (Signed)
Called patient to inform her per Eber Jonesarolyn, NP, patient's last one was done on 10/14/13 and that patient can have it repeated every 3 to 4 months, no more than 3 in a year. I advised the patient that if she has any other problems, questions or concerns to call the office. Patient verbalized understanding.

## 2013-12-08 NOTE — Telephone Encounter (Signed)
Patient wants to know when she is due for another Epidural steroid--please call.

## 2013-12-23 ENCOUNTER — Telehealth: Payer: Self-pay | Admitting: Neurology

## 2013-12-23 DIAGNOSIS — M47817 Spondylosis without myelopathy or radiculopathy, lumbosacral region: Secondary | ICD-10-CM

## 2013-12-23 NOTE — Telephone Encounter (Signed)
I called the patient, she still having back pain and right leg and foot pain. I will get an epidural steroid injection set up again for her, she last had one in January 2015.

## 2013-12-23 NOTE — Telephone Encounter (Signed)
Pt called and stated that she is still having problems with the pain in her right leg and foot.  She stated that she took the medicine last night at 11 pm and had no relief.  She stated that Dr. Anne HahnWillis changed her medications and they don't seem to be working as well.  She said on a pain scale of 1-10 she is usually rating it 6-7 and it is hurting all the time.  She asked if Dr. Anne HahnWillis, his assistant or nurse could give her a call.  Thank you

## 2013-12-23 NOTE — Telephone Encounter (Signed)
Spoke with patient and the pain in her right leg/foot/tailbone and right thigh(hurts so bad, can not lay on it) is not going away completely,medication carbamazepine (CARBATROL) 300 MG 12 hr capsule,is not working

## 2013-12-26 ENCOUNTER — Other Ambulatory Visit: Payer: Self-pay | Admitting: Neurology

## 2013-12-26 DIAGNOSIS — M79604 Pain in right leg: Secondary | ICD-10-CM

## 2013-12-26 DIAGNOSIS — M47817 Spondylosis without myelopathy or radiculopathy, lumbosacral region: Secondary | ICD-10-CM

## 2013-12-31 ENCOUNTER — Ambulatory Visit
Admission: RE | Admit: 2013-12-31 | Discharge: 2013-12-31 | Disposition: A | Discharge status: Home / Self Care | Payer: Medicaid Other | Source: Ambulatory Visit | Attending: Neurology | Admitting: Neurology

## 2013-12-31 DIAGNOSIS — M79604 Pain in right leg: Secondary | ICD-10-CM

## 2013-12-31 DIAGNOSIS — M47817 Spondylosis without myelopathy or radiculopathy, lumbosacral region: Secondary | ICD-10-CM

## 2013-12-31 MED ORDER — IOHEXOL 180 MG/ML  SOLN
1.0000 mL | Freq: Once | INTRAMUSCULAR | Status: AC | PRN
  Administered 2013-12-31: 1 mL via EPIDURAL

## 2013-12-31 MED ORDER — METHYLPREDNISOLONE ACETATE 40 MG/ML INJ SUSP (RADIOLOG
120.0000 mg | Freq: Once | INTRAMUSCULAR | Status: AC
  Administered 2013-12-31: 120 mg via EPIDURAL

## 2013-12-31 NOTE — Discharge Instructions (Signed)

## 2014-02-09 ENCOUNTER — Telehealth: Payer: Self-pay | Admitting: Nurse Practitioner

## 2014-02-09 MED ORDER — GABAPENTIN 600 MG PO TABS: 600.0000 mg | ORAL_TABLET | Freq: Four times a day (QID) | ORAL | Status: DC

## 2014-02-09 NOTE — Telephone Encounter (Signed)
TC to patient, her PCP has changed her Gabapentin to 400mg  three times daily and this is not working as well. Patient is confused about her medications. This was a 20 min phone call. She went to her PCP today and received Torodol shot. She received epidural steroids on 12/26/13 which lasted a week or 2 only. Some of her discomfort is primarily located in the feet, a topical cream may be beneficial. Will increase Gabapentin to 4 times daily 600mg . She verbalizes understanding.

## 2014-02-09 NOTE — Telephone Encounter (Signed)
Patient takes Gabapentin and Carbamazepine 300mg  ER--rt leg is still very painful and it hurts all the time even with taking Tramadol--please call--thank you.

## 2014-02-11 ENCOUNTER — Telehealth: Payer: Self-pay | Admitting: *Deleted

## 2014-02-11 NOTE — Telephone Encounter (Signed)
Spoke with patient and she wants to know how long does it take the gabapentin-600mg  to start working, started her first dose on 05/18,taking as prescribed and she is still having foot, leg and toe pain, crawling sensation. She is still having to take pain pill (tramadol).

## 2014-02-11 NOTE — Telephone Encounter (Signed)
TC to patient. Can increase dose of Gabapentin every 5 days, it has only been 1.5 days since increase. Also transdermal cream was ordered and I am hoping this will be beneficial. She verbalizes understanding.

## 2014-02-17 ENCOUNTER — Telehealth: Payer: Self-pay | Admitting: Nurse Practitioner

## 2014-02-17 NOTE — Telephone Encounter (Signed)
Patient was last seen 10-13-13 and her last epidural injection was 12-31-13. Her next appt is 04-13-14, please advise.

## 2014-02-17 NOTE — Telephone Encounter (Signed)
Patient is scheduled for 02-24-14 @ 1:45. Patient stated she could not come this week she was out of town and did not have a co-pay.

## 2014-02-17 NOTE — Telephone Encounter (Signed)
Patient calling and complaining of severe pain in L leg.  The pain wakes her up at night and some days its very hard for her to get up and down her stairs.  Please call and advise

## 2014-02-17 NOTE — Telephone Encounter (Signed)
Tanya Carter please schedule an appt this week. I have already changed meds recently

## 2014-02-24 ENCOUNTER — Encounter (INDEPENDENT_AMBULATORY_CARE_PROVIDER_SITE_OTHER): Payer: Self-pay

## 2014-02-24 ENCOUNTER — Encounter: Payer: Self-pay | Admitting: Nurse Practitioner

## 2014-02-24 ENCOUNTER — Ambulatory Visit (INDEPENDENT_AMBULATORY_CARE_PROVIDER_SITE_OTHER): Payer: Medicaid Other | Admitting: Nurse Practitioner

## 2014-02-24 VITALS — BP 113/73 | HR 67 | Ht 65.0 in | Wt 152.0 lb

## 2014-02-24 DIAGNOSIS — M545 Low back pain, unspecified: Secondary | ICD-10-CM

## 2014-02-24 DIAGNOSIS — G63 Polyneuropathy in diseases classified elsewhere: Secondary | ICD-10-CM

## 2014-02-24 MED ORDER — NORTRIPTYLINE HCL 25 MG PO CAPS: 25.0000 mg | ORAL_CAPSULE | Freq: Every day | ORAL | Status: DC

## 2014-02-24 MED ORDER — DULOXETINE HCL 60 MG PO CPEP: 60.0000 mg | ORAL_CAPSULE | Freq: Every day | ORAL | Status: DC

## 2014-02-24 NOTE — Patient Instructions (Addendum)
Increase gabapentin to 800 mg 4 times daily Nortriptyline 25 mg at bedtime Continue Carbatrol 300 twice daily Continue Cymbalta 60 mg daily Continue tramadol 50 mg when necessary as needed Followup in 6 months May get repeat epidural in July

## 2014-02-24 NOTE — Progress Notes (Signed)
[facility]  PATIENT: Tanya Carter DOB: [birth year]   REASON FOR VISIT: Peripheral neuropathy, right foot pain    HISTORY OF PRESENT ILLNESS: Tanya Carter, 53 year old female returns for followup. She was last seen 10/13/2013. She has a history of  back pain which has been going on for 15-20 years. The MRI of the lumbosacral spine shows multilevel facet joint arthritis, and some bone marrow edema at the L4-5 level. There may be potential for impingement of the L4 nerve roots bilaterally. 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 She had an epidural injection and nerve block at [facility] [facility] on 06/05/13 with good results for 3 months and 12/31/13 with fair results with relief for about 3 weeks.  She is currently taking Tegretol, Cymbalta, Neurontin and tramadol for her pain. She also sees a podiatrist for  chronic foot problems She returns for reevaluation.     HISTORY: of chronic low back pain dating back 15-20 years. The patient was told that she had severe nerve root impingement prior to surgery. The patient has had 2 lumbosacral spine surgeries that were done in [place]. The patient has had some chronic low back pain since that time, and some difficulty controlling the bladder and bowels. The patient indicates that 4 months ago, she began having a significant increase in the low back pain. The patient has pain down both legs, worse on the right side. The patient will have an electric shock pain going up the right leg. The patient will have a dull achy pain in the left hip and back. The patient feels normal on the right leg. The patient has not had any falls but she feels as if the legs are weak. The patient denies any neck pain or shoulder or arm discomfort. The patient is on gabapentin without complete relief.   REVIEW OF SYSTEMS: Full 14 system review of systems performed and notable only for those listed, all others are neg:    Constitutional: N/A  Cardiovascular: N/A  Ear/Nose/Throat: N/A  Skin: N/A  Eyes: N/A  Respiratory: N/A  Gastroitestinal: N/A  Hematology/Lymphatic: N/A  Endocrine: N/A Musculoskeletal: Walking difficulty  Allergy/Immunology: N/A  Neurological: N/A Psychiatric: N/A Sleep : NA   ALLERGIES: Allergies  Allergen Reactions  . Klonopin [Clonazepam] Other (See Comments)    Delirium,Hallucination    HOME MEDICATIONS: Outpatient Prescriptions Prior to Visit  Medication Sig Dispense Refill  . (No Medication Selected) Apply 1 application topically 4 (four) times daily. Take as directed.      . carbamazepine (CARBATROL) 300 MG 12 hr capsule Take 1 capsule (300 mg total) by mouth 2 (two) times daily.  60 capsule  5  . cyanocobalamin (,VITAMIN B-12,) 1000 MCG/ML injection Inject 1,000 mcg into the muscle every 30 (thirty) days.      . DULoxetine (CYMBALTA) 60 MG capsule Take 1 capsule (60 mg total) by mouth daily.  30 capsule  6  . pantoprazole (PROTONIX) 40 MG tablet Take 40 mg by mouth daily.      . traMADol (ULTRAM) 50 MG tablet TAKE ONE TABLET BY MOUTH EVERY 6 HOURS AS NEEDED FOR PAIN  30 tablet  3  . gabapentin (NEURONTIN) 600 MG tablet Take 1 tablet (600 mg total) by mouth 4 (four) times daily.  120 tablet  6  . mupirocin ointment (BACTROBAN) 2 % Apply 2 application topically 3 (three) times daily.       No facility-administered medications prior to visit.    PAST  MEDICAL HISTORY: Past Medical History  Diagnosis Date  . Cauda equina compression   . Incontinence of feces   . Incontinence of urine   . Chronic pain     sacral  . Anxiety and depression   . Ulcer of heel and midfoot     Managed by [facility]  . GERD (gastroesophageal reflux disease)   . Lumbago   . Unspecified constipation   . Other specified disorders of bladder   . Polyneuropathy in other diseases classified elsewhere 06/16/2013    PAST SURGICAL HISTORY: Past Surgical History  Procedure  Laterality Date  . Rectocele repair    . Gallbladder surgery    . Tubal ligation    . Lumbar laminectomy      2 occasions    FAMILY HISTORY: Family History  Problem Relation Age of Onset  . Stroke Mother     SOCIAL HISTORY: History   Social History  . Marital Status: Widowed    Spouse Name: N/A    Number of Children: 3  . Years of Education: 12   Occupational History  . unemployed    Social History Main Topics  . Smoking status: Current Every Day Smoker -- 0.50 packs/day for 3 years  . Smokeless tobacco: Never Used  . Alcohol Use: No     Comment: past:occasionally  . Drug Use: No  . Sexual Activity: Not on file   Other Topics Concern  . Not on file   Social History Narrative   Patient lives at home alone.    Patient has 3 children.    Patient is right-handed   Patient has a 12th grade education.   Patient is widowed.    Patient is on disability.     PHYSICAL EXAM  Filed Vitals:   02/24/14 1405  BP: 113/73  Pulse: 67  Height: 5\' 5"  (1.651 m)  Weight: 152 lb (68.947 kg)   Body mass index is 25.29 kg/(m^2).  Generalized: Well developed, in no acute distress  Head: normocephalic and atraumatic,. Oropharynx benign  Neck: Supple, no carotid bruits  Cardiac: Regular rate rhythm, no murmur  Musculoskeletal: Lacks about 15 full flexion lower back   Neurological examination   Mentation: Alert oriented to time, place, history taking. Follows all commands speech and language fluent  Cranial nerve II-XII: .Pupils were equal round reactive to light extraocular movements were full, visual field were full on confrontational test. Facial sensation and strength were normal. hearing was intact to finger rubbing bilaterally. Uvula tongue midline. head turning and shoulder shrug were normal and symmetric.Tongue protrusion into cheek strength was normal. Motor: normal bulk and tone, full strength in the BUE, BLE, fine finger movements normal, no pronator drift. No  focal weakness Sensory: normal and symmetric to light touch, pinprick, and  vibration  Coordination: finger-nose-finger, heel-to-shin bilaterally, no dysmetria Reflexes: Brachioradialis 2/2, biceps 2/2, triceps 2/2, patellar 2/2, Achilles 1/1, plantar responses were flexor bilaterally. Gait and Station: Rising up from seated position without assistance, wide based limping gait unable to perform tiptoe, and heel walking.  Tandem gait is unsteady  DIAGNOSTIC DATA (LABS, IMAGING, TESTING) - I reviewed patient records, labs, notes, testing and imaging myself where available.  Lab Results  Component Value Date   WBC 5.7 06/16/2013   HGB 14.6 06/16/2013   HCT 43.1 06/16/2013   MCV 95 06/16/2013   PLT 302 06/16/2013      Component Value Date/Time   NA 139 06/16/2013 1417   K 4.1 06/16/2013  1417   CL 98 06/16/2013 1417   CO2 27 06/16/2013 1417   GLUCOSE 85 06/16/2013 1417   BUN 14 06/16/2013 1417   CREATININE 0.79 06/16/2013 1417   CALCIUM 9.4 06/16/2013 1417   PROT 7.6 06/16/2013 1417   AST 11 06/16/2013 1417   ALT 10 06/16/2013 1417   ALKPHOS 92 06/16/2013 1417   BILITOT 0.2 06/16/2013 1417   GFRNONAA 87 06/16/2013 1417   GFRAA 100 06/16/2013 1417    Lab Results  Component Value Date   VITAMINB12 308 06/16/2013   Lab Results  Component Value Date   TSH 2.070 06/16/2013      ASSESSMENT AND PLAN  53 y.o. year old female  has a past medical history of Cauda equina compression;  Chronic pain; Anxiety and depression; Lumbago;  and Polyneuropathy. The MRI of the lumbosacral spine shows multilevel facet joint arthritis, and some bone marrow edema at the L4-5 level. There may be potential for impingement of the L4 nerve roots bilaterally. 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  Increase gabapentin to 800 mg 4 times daily  Nortriptyline 25 mg at bedtime Continue Carbatrol 300 twice daily Continue Cymbalta 60 mg daily Continue tramadol 50 mg when necessary as  needed Followup in 6 months May get repeat epidural in July Nilda RiggsNancy Carolyn Martin, [facility] HospitalGNP, [facility]BC, [facility] IncPRN  [facility] [address], [address] [place], [place] [address] [phone]

## 2014-02-24 NOTE — Progress Notes (Signed)
I have read the note, and I agree with the clinical assessment and plan.  Charles K Willis   

## 2014-03-04 ENCOUNTER — Telehealth: Payer: Self-pay | Admitting: Nurse Practitioner

## 2014-03-04 MED ORDER — NORTRIPTYLINE HCL 25 MG PO CAPS: 50.0000 mg | ORAL_CAPSULE | Freq: Every day | ORAL | Status: DC

## 2014-03-04 NOTE — Telephone Encounter (Signed)
Patient called stating that her legs hurt really bad. She wanted to know if Tanya Carter could prescribe her anything that would help. Please call to advise.

## 2014-03-04 NOTE — Telephone Encounter (Signed)
Spoke with patient, said that her legs hurt and the Tramadol -50 mg is not helping. She cannot go to a pain clinic, is there anything else she can take? The Nortriptyline at night helps but is having constant pain during the daytime.

## 2014-03-04 NOTE — Telephone Encounter (Signed)
Please have patient increase Nortriptyline to 2 tabs at HS. RX faxed. She is already taking Neurontin, Carbatrol, Cymbalta Ultram and Nortriptyline.

## 2014-03-05 NOTE — Telephone Encounter (Signed)
Patient calling to ask more questions regarding her medication, please return call and advise.

## 2014-03-05 NOTE — Telephone Encounter (Addendum)
Spoke with patient and she said that she did pick up the prescription,will try and see if this helps with the daytime, will call back next week if symptoms stay the same or gets worse.

## 2014-03-09 ENCOUNTER — Telehealth: Payer: Self-pay | Admitting: Nurse Practitioner

## 2014-03-09 DIAGNOSIS — M545 Low back pain, unspecified: Secondary | ICD-10-CM

## 2014-03-09 MED ORDER — TRAMADOL HCL 50 MG PO TABS: 50.0000 mg | ORAL_TABLET | Freq: Four times a day (QID) | ORAL | Status: DC | PRN

## 2014-03-09 NOTE — Telephone Encounter (Signed)
Willis patient being seen by Eber Jonesarolyn lately. Sent to you since Eber JonesCarolyn is on vacation this week.  States provider knows she is having problems with her leg but the leg that is hurting, the pain is beginning to go into her head. She has some questions to ask provider about what she is going through. She is requesting a call from carolyn.

## 2014-03-09 NOTE — Telephone Encounter (Signed)
I called the patient. The patient is having increased pain in the right leg with the hip pain going down to the knee. I will try another epidural steroid injection. The prior 2 injections have been transforaminal injections at the L4 level. The patient is only getting 2 weeks of benefit with these types of injections.

## 2014-03-09 NOTE — Telephone Encounter (Signed)
States provider knows she is having problems with her leg but the leg that is hurting, the pain is beginning to go into her head. She has some questions to ask provider about what she is going through. She is requesting a call from carolyn.

## 2014-03-12 ENCOUNTER — Other Ambulatory Visit: Payer: Self-pay | Admitting: Neurology

## 2014-03-12 DIAGNOSIS — M5442 Lumbago with sciatica, left side: Principal | ICD-10-CM

## 2014-03-12 DIAGNOSIS — M5441 Lumbago with sciatica, right side: Secondary | ICD-10-CM

## 2014-03-16 ENCOUNTER — Ambulatory Visit
Admission: RE | Admit: 2014-03-16 | Discharge: 2014-03-16 | Disposition: A | Discharge status: Home / Self Care | Payer: Medicaid Other | Source: Ambulatory Visit | Attending: Neurology | Admitting: Neurology

## 2014-03-16 VITALS — BP 115/66 | HR 79

## 2014-03-16 DIAGNOSIS — M5442 Lumbago with sciatica, left side: Principal | ICD-10-CM

## 2014-03-16 DIAGNOSIS — M5441 Lumbago with sciatica, right side: Secondary | ICD-10-CM

## 2014-03-16 MED ORDER — METHYLPREDNISOLONE ACETATE 40 MG/ML INJ SUSP (RADIOLOG
120.0000 mg | Freq: Once | INTRAMUSCULAR | Status: AC
  Administered 2014-03-16: 120 mg via EPIDURAL

## 2014-03-16 MED ORDER — IOHEXOL 180 MG/ML  SOLN
1.0000 mL | Freq: Once | INTRAMUSCULAR | Status: AC | PRN
  Administered 2014-03-16: 1 mL via EPIDURAL

## 2014-03-16 NOTE — Discharge Instructions (Signed)

## 2014-04-13 ENCOUNTER — Ambulatory Visit: Payer: Medicaid Other | Admitting: Nurse Practitioner

## 2014-04-21 ENCOUNTER — Telehealth: Payer: Self-pay | Admitting: Nurse Practitioner

## 2014-04-21 MED ORDER — NORTRIPTYLINE HCL 25 MG PO CAPS: 75.0000 mg | ORAL_CAPSULE | Freq: Every day | ORAL | Status: DC

## 2014-04-21 NOTE — Telephone Encounter (Signed)
Spoke to patient informed her to increase Pamelor to 3 tabs at bedtime and her pharmacy has already been notified, patient verbalized understanding will call back if any questions or concerns.

## 2014-04-21 NOTE — Telephone Encounter (Signed)
Have  Patient increase her Pamelor to 3 tabs at night. Called to pharmacy already Please call

## 2014-04-21 NOTE — Telephone Encounter (Signed)
Spoke with patient and she said that the epidural steroid injection helped up until 2 days ago, was having sharp right leg pain,waking her up at night, today just having pain

## 2014-04-21 NOTE — Telephone Encounter (Signed)
Patient is having shooting pain in her right leg and would like a call back on what she should do. Please call to advise.

## 2014-04-27 ENCOUNTER — Telehealth: Payer: Self-pay | Admitting: Nurse Practitioner

## 2014-04-27 MED ORDER — GABAPENTIN 800 MG PO TABS: 800.0000 mg | ORAL_TABLET | Freq: Four times a day (QID) | ORAL | Status: DC

## 2014-04-27 NOTE — Telephone Encounter (Signed)
Rx has been sent  

## 2014-04-27 NOTE — Telephone Encounter (Signed)
Patient calling to request Gabapentin refill, states that she was instructed to call us when she ran out.

## 2014-04-28 ENCOUNTER — Other Ambulatory Visit: Payer: Self-pay | Admitting: Neurology

## 2014-04-28 NOTE — Telephone Encounter (Signed)
Rx signed and faxed.

## 2014-04-30 NOTE — Telephone Encounter (Signed)
Noted  

## 2014-05-18 ENCOUNTER — Other Ambulatory Visit: Payer: Self-pay | Admitting: Neurology

## 2014-05-27 ENCOUNTER — Telehealth: Payer: Self-pay | Admitting: Nurse Practitioner

## 2014-05-27 DIAGNOSIS — M545 Low back pain: Secondary | ICD-10-CM

## 2014-05-27 NOTE — Telephone Encounter (Signed)
I have put the order in please let the patient know

## 2014-05-27 NOTE — Telephone Encounter (Signed)
Patient wanting to know if it is time for her to get another steroid injection, please advise.

## 2014-05-27 NOTE — Telephone Encounter (Signed)
Left patient a message about the order being put in. If the patient has any questions she can call the office back.

## 2014-05-27 NOTE — Telephone Encounter (Signed)
Patient questioning when can she have the next injection?  Please call anytime and leave detailed message on voice mail.

## 2014-06-04 ENCOUNTER — Telehealth: Payer: Self-pay | Admitting: Nurse Practitioner

## 2014-06-04 DIAGNOSIS — M47817 Spondylosis without myelopathy or radiculopathy, lumbosacral region: Secondary | ICD-10-CM

## 2014-06-04 NOTE — Telephone Encounter (Signed)
Patient calling with questions about an appointment for injections. Her best number is [phone]. It is okay to leave a message at this number.

## 2014-06-10 MED ORDER — TAPENTADOL HCL 100 MG PO TABS: 100.0000 mg | ORAL_TABLET | Freq: Four times a day (QID) | ORAL | Status: DC | PRN

## 2014-06-10 NOTE — Telephone Encounter (Signed)
Patient calling again to state she hasn't heard anything back about scheduling her back injection, please return call and advise.

## 2014-06-10 NOTE — Telephone Encounter (Signed)
Spoke to patient and she is aware that the referral was sent over 2 times and was just faxed again for the 3rd time. Patient is now wanting Dr. Anne Hahn to prescribe her something stronger for her pain. Patient stated that pain management told her there was nothing they could do for her. Please advise

## 2014-06-10 NOTE — Telephone Encounter (Signed)
The patient is to receive an epidural steroid injection on her low back, she has not heard anything yet. She wishes to have the injection if possible done at [facility]. I will try to get this set up. I'll change patient from Ultram to Nucynta to see this helps the pain a bit better.

## 2014-06-11 ENCOUNTER — Other Ambulatory Visit: Payer: Self-pay | Admitting: Neurology

## 2014-06-11 DIAGNOSIS — M47817 Spondylosis without myelopathy or radiculopathy, lumbosacral region: Secondary | ICD-10-CM

## 2014-06-11 NOTE — Telephone Encounter (Signed)
Called pt to inform her that her Rx was ready to be picked up at the front desk and if she has any other problems, questions or concerns to call the office. Pt verbalized understanding. °

## 2014-06-18 ENCOUNTER — Other Ambulatory Visit: Payer: Self-pay | Admitting: Neurology

## 2014-06-18 ENCOUNTER — Ambulatory Visit
Admission: RE | Admit: 2014-06-18 | Discharge: 2014-06-18 | Disposition: A | Discharge status: Home / Self Care | Payer: Medicaid Other | Source: Ambulatory Visit | Attending: Neurology | Admitting: Neurology

## 2014-06-18 VITALS — BP 115/78 | HR 74

## 2014-06-18 DIAGNOSIS — M47817 Spondylosis without myelopathy or radiculopathy, lumbosacral region: Secondary | ICD-10-CM

## 2014-06-18 MED ORDER — IOHEXOL 180 MG/ML  SOLN
1.0000 mL | Freq: Once | INTRAMUSCULAR | Status: AC | PRN
  Administered 2014-06-18: 1 mL via EPIDURAL

## 2014-06-18 MED ORDER — METHYLPREDNISOLONE ACETATE 40 MG/ML INJ SUSP (RADIOLOG
120.0000 mg | Freq: Once | INTRAMUSCULAR | Status: AC
  Administered 2014-06-18: 120 mg via EPIDURAL

## 2014-07-20 ENCOUNTER — Other Ambulatory Visit: Payer: Self-pay | Admitting: Nurse Practitioner

## 2014-07-24 ENCOUNTER — Other Ambulatory Visit: Payer: Self-pay

## 2014-07-24 MED ORDER — NORTRIPTYLINE HCL 25 MG PO CAPS: ORAL_CAPSULE | ORAL | Status: DC

## 2014-07-24 MED ORDER — DULOXETINE HCL 60 MG PO CPEP: 60.0000 mg | ORAL_CAPSULE | Freq: Every day | ORAL | Status: AC

## 2014-07-24 MED ORDER — CARBAMAZEPINE ER 300 MG PO CP12: ORAL_CAPSULE | ORAL | Status: DC

## 2014-07-24 MED ORDER — GABAPENTIN 800 MG PO TABS: 800.0000 mg | ORAL_TABLET | Freq: Four times a day (QID) | ORAL | Status: AC

## 2014-08-02 ENCOUNTER — Telehealth: Payer: Self-pay

## 2014-08-02 NOTE — Telephone Encounter (Signed)
[employer] notified us they have approved our request for coverage on Nucynta effective until 09/24/2014 Ref # [id number]

## 2014-08-05 ENCOUNTER — Other Ambulatory Visit: Payer: Self-pay | Admitting: Neurology

## 2014-08-05 MED ORDER — TAPENTADOL HCL 100 MG PO TABS: 100.0000 mg | ORAL_TABLET | Freq: Four times a day (QID) | ORAL | Status: DC | PRN

## 2014-08-05 NOTE — Telephone Encounter (Signed)
Request entered, forwarded to provider for approval.  

## 2014-08-05 NOTE — Telephone Encounter (Signed)
Patient calling to request Nucynta refill, advised patient to call her pharmacy next time to request a refill.

## 2014-08-06 NOTE — Addendum Note (Signed)
Addended by: Arlis PortaHUGHES, Mercedez Boule on: 08/06/2014 08:40 AM   Modules accepted: Medications

## 2014-08-06 NOTE — Telephone Encounter (Signed)
I called the patient to let them know their Rx for Tapentadol/Nucynta was ready for pickup. Patient was instructed to bring Photo ID.

## 2014-08-26 ENCOUNTER — Ambulatory Visit: Payer: Medicaid Other | Admitting: Nurse Practitioner

## 2014-08-26 ENCOUNTER — Telehealth: Payer: Self-pay | Admitting: Nurse Practitioner

## 2014-08-26 NOTE — Telephone Encounter (Signed)
TC to patient. She had a referral [facility] for epid[facility] injection placed by Dr. Anne HahnWillis but the patient claims no one [facility] has [facility] her. Will resend the referral. She will continue her current meds Cymbalta 60mg  daily, Gabapentin 8oomg QID and Nortriptyline 75mg  hs along with Nucynta 100mg  prn. She will call back if she does not hear from the hospital for epidural.

## 2014-08-26 NOTE — Telephone Encounter (Signed)
Pt states that her left leg is really hurting again.  She just wanted you to know and she did try to make her appointment today, but the person that she rides with is in the hospital.

## 2014-09-01 ENCOUNTER — Other Ambulatory Visit: Payer: Self-pay | Admitting: Nurse Practitioner

## 2014-09-01 MED ORDER — TAPENTADOL HCL 100 MG PO TABS: 100.0000 mg | ORAL_TABLET | Freq: Four times a day (QID) | ORAL | Status: DC | PRN

## 2014-09-01 NOTE — Telephone Encounter (Signed)
Pt is calling requesting a written Rx for Tapentadol HCl (NUCYNTA) 100 MG TABS. Please call when ready for pick up.

## 2014-09-01 NOTE — Telephone Encounter (Signed)
Request entered, forwarded to provider for approval.  

## 2014-09-02 NOTE — Telephone Encounter (Signed)
I called the patient to let them know their Rx for Nucynta was ready for pickup. Patient was instructed to bring Photo ID. 

## 2014-09-09 NOTE — Telephone Encounter (Signed)
I called the patient to confirm with her that Tanya Carter did re-enter the referral for [name] for epidural injection. She will call back within a week if she hasn't heard anything.

## 2014-09-09 NOTE — Telephone Encounter (Signed)
Patient states she still has still not heard from [facility]  to get an epidural injection. Please call patient and advise.

## 2014-09-21 ENCOUNTER — Telehealth: Payer: Self-pay | Admitting: Nurse Practitioner

## 2014-09-21 DIAGNOSIS — M47817 Spondylosis without myelopathy or radiculopathy, lumbosacral region: Secondary | ICD-10-CM

## 2014-09-21 NOTE — Telephone Encounter (Signed)
Pt is calling back stating she still has not heard anything from [facility] regarding the epidural shot referral.  She was told to call back in a week.  Please call pt back and advise what she needs to do.

## 2014-09-21 NOTE — Telephone Encounter (Signed)
Per Denese KillingsJanisha, Not sure what happened with 1st order they have no documentation. They state that the order only lasts for 30 days and that they would now need a new order with CPT code and ICD-10 code included on order. Sending to NP to re-enter order.

## 2014-09-21 NOTE — Telephone Encounter (Signed)
Will resend referral Please call patient to let her know when is the appt.

## 2014-09-22 NOTE — Telephone Encounter (Signed)
Called [facility] and scheduled the appointment for 09/24/14. informed patient

## 2014-10-08 ENCOUNTER — Ambulatory Visit: Payer: Medicaid Other | Admitting: Nurse Practitioner

## 2014-10-13 ENCOUNTER — Encounter: Payer: Self-pay | Admitting: Neurology

## 2014-10-13 ENCOUNTER — Ambulatory Visit: Payer: Medicaid Other | Admitting: Nurse Practitioner

## 2014-10-14 ENCOUNTER — Telehealth: Payer: Self-pay | Admitting: Nurse Practitioner

## 2014-10-14 NOTE — Telephone Encounter (Signed)
Patient is calling to get a returned call. States message is personal. Please call.

## 2014-10-20 ENCOUNTER — Telehealth: Payer: Self-pay | Admitting: Neurology

## 2014-10-20 MED ORDER — TAPENTADOL HCL 100 MG PO TABS: 100.0000 mg | ORAL_TABLET | Freq: Four times a day (QID) | ORAL | Status: AC | PRN

## 2014-10-20 NOTE — Telephone Encounter (Signed)
Patient is calling for a written Rx Nucynta 100 mg. Patient states that 50 tablets does not last her a month.  Please call.

## 2014-10-20 NOTE — Telephone Encounter (Signed)
I called patient. We will increase the number of tablets to 60 a month. The patient still has a lot of leg pain, particularly on the left going from the little toe up to the back. The patient has significant changes in the lumbosacral spine by MRI done back in 2014. She has been getting some epidural steroid injections on the back off and on.

## 2014-10-20 NOTE — Telephone Encounter (Signed)
I called back.  Patient said she needs a refill on Nucynta, however she would like to know if the quantity could be increased.  States #50 only lasts 20-25 days.  Please advise.  Thank you.

## 2014-10-21 ENCOUNTER — Telehealth: Payer: Self-pay

## 2014-10-21 NOTE — Telephone Encounter (Signed)
Called patient and informed Rx ready for pick up at front desk. Patient verbalized understanding.  

## 2014-10-30 ENCOUNTER — Other Ambulatory Visit: Payer: Self-pay | Admitting: Neurology

## 2014-11-04 ENCOUNTER — Other Ambulatory Visit: Payer: Self-pay | Admitting: Nurse Practitioner

## 2014-11-27 ENCOUNTER — Telehealth: Payer: Self-pay | Admitting: Nurse Practitioner

## 2014-11-27 DIAGNOSIS — M47817 Spondylosis without myelopathy or radiculopathy, lumbosacral region: Secondary | ICD-10-CM

## 2014-11-27 NOTE — Telephone Encounter (Signed)
Patient is calling because she is having muscle spasms really bad in her right thigh. Patient would like an order sent to [facility] to have an injection there today. Please call and advise. Thank you.

## 2014-11-27 NOTE — Telephone Encounter (Signed)
This would have to be PREapproved by insurance. If she needs today she (must go to the ER)Please call the patient. I will be glad to set up for next week.

## 2014-11-27 NOTE — Telephone Encounter (Signed)
Spoke to patient. She said next week will be fine. She is going out of town, ok to arrange after Monday. She says if her pain gets any worse, she will go to the ER this weekend.

## 2014-11-29 ENCOUNTER — Other Ambulatory Visit: Payer: Self-pay | Admitting: Neurology

## 2014-11-30 NOTE — Telephone Encounter (Signed)
Please let patient know referral is in the system.

## 2014-12-02 NOTE — Telephone Encounter (Signed)
Let patient know referral in system.

## 2014-12-07 ENCOUNTER — Telehealth: Payer: Self-pay | Admitting: Nurse Practitioner

## 2014-12-07 NOTE — Telephone Encounter (Signed)
Tanya Carter with [facility] @ [phone], questioning if Rx Tapentadol HCl (NUCYNTA) 100 MG TABS a 1 x script or if patent will receive on a regular basis.  Please call and advise.

## 2014-12-07 NOTE — Telephone Encounter (Signed)
I called back.  I spoke with Natalia LeatherwoodKatherine, who transferred me to [place].  Verified info.

## 2014-12-08 ENCOUNTER — Telehealth: Payer: Self-pay | Admitting: Nurse Practitioner

## 2014-12-08 NOTE — Telephone Encounter (Signed)
Spoke to [employer]xon Mobil CorporationDelane. She wanted to verify the last office visit for patient was 02/24/14. Verified info. She stated she needed an H&P within the last 30 days in order to proceed. She said she would see what could be done on her end, talk with the patient, and would c/b if there is a need too.

## 2014-12-08 NOTE — Telephone Encounter (Signed)
Noted  

## 2014-12-08 NOTE — Telephone Encounter (Signed)
Delane with [facility] at [facility] @ [phone], stated [facility].  Patient is there now, please call and request a transfer to Specialty.

## 2014-12-14 ENCOUNTER — Telehealth: Payer: Self-pay | Admitting: Nurse Practitioner

## 2014-12-14 NOTE — Telephone Encounter (Signed)
TC to [name], NP.  They are concerned about increasing Cymbalta to 90mg  with Nucynta because of serotonin syndrome. They suggest decreasing Nucynta to 50mg  every 6 hours. It does not appear she is taking the med as ordered since she has not had refills. I am seeing patient on Wednesday. Made Tanya Carter aware I have tried to get patient to be managed in [place] she [facility]. It would be nice to get a pain clinic to manage her. This would be better coordination of care and she is agreeable.

## 2014-12-14 NOTE — Telephone Encounter (Signed)
[name], NP with [facility] @ [phone] would like Eber Jonesarolyn, NP to return call to discuss patient's medication.  Please call and advise.

## 2014-12-16 ENCOUNTER — Ambulatory Visit: Payer: Medicaid Other | Admitting: Nurse Practitioner

## 2014-12-16 ENCOUNTER — Ambulatory Visit (INDEPENDENT_AMBULATORY_CARE_PROVIDER_SITE_OTHER): Payer: Medicare HMO | Admitting: Nurse Practitioner

## 2014-12-16 ENCOUNTER — Encounter: Payer: Self-pay | Admitting: Nurse Practitioner

## 2014-12-16 VITALS — BP 115/79 | HR 71 | Ht 65.0 in | Wt 154.8 lb

## 2014-12-16 DIAGNOSIS — G63 Polyneuropathy in diseases classified elsewhere: Secondary | ICD-10-CM

## 2014-12-16 DIAGNOSIS — M5440 Lumbago with sciatica, unspecified side: Secondary | ICD-10-CM | POA: Diagnosis not present

## 2014-12-16 NOTE — Progress Notes (Signed)
[facility]  PATIENT: Cira Rueatricia Nieblas DOB: 04/25/1961   REASON FOR VISIT: Follow-up for peripheral neuropathy, low back pain , right foot pain HISTORY FROM: Patient    HISTORY OF PRESENT ILLNESS:Ms. Ceasar Lundrone, 54 year old female returns for followup. She was last seen 02/24/14. She has a history of back pain which has been going on for 15-20 years. The MRI of the lumbosacral spine shows multilevel facet joint arthritis, and some bone marrow edema at the L4-5 level. There may be potential for impingement of the L4 nerve roots bilaterally. 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 She had an epidural injection and [facility] on 12/14/14. Previously she had gotten those [facility]. She has good relief from her back pain at present She is currently taking Tegretol, Cymbalta, Neurontin .  She also sees a podiatrist for chronic foot problems and is due to have surgery next month. Her primary care provider in [facility] called earlier in the week leading to increase her Cymbalta but was concerned about reaction with Nucynta. They're also wondering why patient is being seen in [place] she [place] in [place].  She returns for reevaluation.She tells me today she has transportation issues getting to [place].    HISTORY: of chronic low back pain dating back 15-20 years. The patient was told that she had severe nerve root impingement prior to surgery. The patient has had 2 lumbosacral spine surgeries that were [place] in [place]. The patient has had some chronic low back pain since that time, and some difficulty controlling the bladder and bowels. The patient indicates that 4 months ago, she began having a significant increase in the low back pain. The patient has pain down both legs, worse on the right side. The patient will have an electric shock pain going up the right leg. The patient will have a dull achy pain in  the left hip and back. The patient feels normal on the right leg. The patient has not had any falls but she feels as if the legs are weak. The patient denies any neck pain or shoulder or arm discomfort. The patient is on gabapentin without complete relief.    REVIEW OF SYSTEMS: Full 14 system review of systems performed and notable only for those listed, all others are neg:  Constitutional: Fatigue Cardiovascular: neg Ear/Nose/Throat: neg  Skin: neg Eyes: neg Respiratory: neg Gastroitestinal: neg  Hematology/Lymphatic: neg  Endocrine: neg Musculoskeletal: Back pain Allergy/Immunology: neg Neurological: Occasional headache Psychiatric: Depression and anxiety Sleep : neg   ALLERGIES: Allergies  Allergen Reactions  . Klonopin [Clonazepam] Other (See Comments)    Delirium,Hallucination    HOME MEDICATIONS: Outpatient Prescriptions Prior to Visit  Medication Sig Dispense Refill  . carbamazepine (CARBATROL) 300 MG 12 hr capsule TAKE 1 CAPSULE TWICE DAILY 180 capsule 0  . cyanocobalamin (,VITAMIN B-12,) 1000 MCG/ML injection Inject 1,000 mcg into the muscle every 30 (thirty) days.    . DULoxetine (CYMBALTA) 60 MG capsule Take 1 capsule (60 mg total) by mouth daily. 90 capsule 0  . gabapentin (NEURONTIN) 800 MG tablet Take 1 tablet (800 mg total) by mouth 4 (four) times daily. 360 tablet 0  . nortriptyline (PAMELOR) 25 MG capsule TAKE 3 CAPSULES EVERY NIGHT AT BEDTIME 270 capsule 0  . pantoprazole (PROTONIX) 40 MG tablet Take 40 mg by mouth daily.    . Tapentadol HCl (NUCYNTA) 100 MG TABS Take 1 tablet (100 mg total) by mouth every 6 (  six) hours as needed. 60 tablet 0  . (No Medication Selected) Apply 1 application topically 4 (four) times daily. Take as directed.    . benzonatate (TESSALON) 100 MG capsule   0  . PROAIR HFA 108 (90 BASE) MCG/ACT inhaler   1  . DULoxetine (CYMBALTA) 60 MG capsule TAKE ONE CAPSULE BY MOUTH DAILY 90 capsule 0  . gabapentin (NEURONTIN) 800 MG tablet TAKE  1 TABLET BY MOUTH FOUR TIMES DAILY 360 tablet 0  . gabapentin (NEURONTIN) 800 MG tablet TAKE 1 TABLET FOUR TIMES DAILY 360 tablet 0   No facility-administered medications prior to visit.    PAST MEDICAL HISTORY: Past Medical History  Diagnosis Date  . Cauda equina compression   . Incontinence of feces   . Incontinence of urine   . Chronic pain     sacral  . Anxiety and depression   . Ulcer of heel and midfoot     Managed by [facility]  . GERD (gastroesophageal reflux disease)   . Lumbago   . Unspecified constipation   . Other specified disorders of bladder   . Polyneuropathy in other diseases classified elsewhere 06/16/2013    PAST SURGICAL HISTORY: Past Surgical History  Procedure Laterality Date  . Rectocele repair    . Gallbladder surgery    . Tubal ligation    . Lumbar laminectomy      2 occasions    FAMILY HISTORY: Family History  Problem Relation Age of Onset  . Stroke Mother     SOCIAL HISTORY: History   Social History  . Marital Status: Widowed    Spouse Name: N/A  . Number of Children: 3  . Years of Education: 12   Occupational History  . unemployed    Social History Main Topics  . Smoking status: Current Every Day Smoker -- 0.50 packs/day for 3 years  . Smokeless tobacco: Never Used  . Alcohol Use: No     Comment: past:occasionally  . Drug Use: No  . Sexual Activity: Not on file   Other Topics Concern  . Not on file   Social History Narrative   Patient lives at home alone.    Patient has 3 children.    Patient is right-handed   Patient has a 12th grade education.   Patient is widowed.    Patient is on disability.     PHYSICAL EXAM  Filed Vitals:   12/16/14 1032  BP: 115/79  Pulse: 71  Height:  (1.651 m)  Weight: 154 lb 12.8 oz (70.217 kg)   Body mass index is 25.76 kg/(m^2). Generalized: Well developed, in no acute distress  Head: normocephalic and atraumatic,. Oropharynx benign  Neck: Supple, no carotid  bruits  Musculoskeletal: Lacks about 15 full flexion lower back   Neurological examination   Mentation: Alert oriented to time, place, history taking. Follows all commands speech and language fluent  Cranial nerve II-XII: .Pupils were equal round reactive to light extraocular movements were full, visual field were full on confrontational test. Facial sensation and strength were normal. hearing was intact to finger rubbing bilaterally. Uvula tongue midline. head turning and shoulder shrug were normal and symmetric.Tongue protrusion into cheek strength was normal. Motor: normal bulk and tone, full strength in the BUE, BLE, fine finger movements normal, no pronator drift. No focal weakness Sensory: normal and symmetric to light touch, pinprick, and vibration  Coordination: finger-nose-finger, heel-to-shin bilaterally, no dysmetria Reflexes: Brachioradialis 2/2, biceps 2/2, triceps 2/2, patellar 2/2, Achilles 1/1,  plantar responses were flexor bilaterally. Gait and Station: Rising up from seated position without assistance, wide based limping gait unable to perform tiptoe, and heel walking. Tandem gait is unsteady. No assistive device  DIAGNOSTIC DATA (LABS, IMAGING, TESTING) -  ASSESSMENT AND PLAN  54 y.o. 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  Continue gabapentin 800 mg 4 times daily  Continue Carbatrol 300 mg twice daily Continue nortriptyline 75 mg at night Nilda Riggs, [facility], APRN  Late entry called and spoke with Mar Daring NP with [facility]. Patient is willing to transfer care to [facility] for better  coordination of care. Made her aware that we also would no longer prescribe meds. She will make appropriate referrals as needed.   [facility] [address] [place], [place] [address] [phone]

## 2014-12-16 NOTE — Progress Notes (Signed)
I have read the note, and I agree with the clinical assessment and plan.  Dior Dominik KEITH   

## 2014-12-16 NOTE — Patient Instructions (Signed)
Good relief from epidurals Good time to transfer care to [place] Patient does not have transportation Will suggest increase in Cynbalta and stopping Nucynta.  Continue gabapentin Continue Carbatrol Continue nortriptyline

## 2015-01-27 ENCOUNTER — Other Ambulatory Visit: Payer: Self-pay | Admitting: Neurology

## 2015-02-26 ENCOUNTER — Other Ambulatory Visit: Payer: Self-pay | Admitting: Neurology

## 2015-02-28 NOTE — Telephone Encounter (Signed)
Last OV note says: Patient is willing to [facility] for better coordination of care. Made her aware that we also would no longer prescribe meds.

## 2015-04-02 ENCOUNTER — Other Ambulatory Visit: Payer: Self-pay | Admitting: Neurology

## 2015-04-03 NOTE — Telephone Encounter (Signed)
Last OV note says: Late entry called and spoke with [name] NP with [facility]. Patient is willing to [facility] better [facility] of care. Made her aware that we also would no longer prescribe meds. She will make appropriate referrals as needed.

## 2015-07-22 NOTE — Telephone Encounter (Signed)
Error

## 2016-12-24 DEATH — deceased
